# Patient Record
Sex: Female | Born: 2000 | Race: Black or African American | Hispanic: No | Marital: Single | Smoking: Never smoker
Health system: Southern US, Community
[De-identification: ages and names within clinical notes are randomized; demographics above are authoritative.]

## PROBLEM LIST (undated history)

## (undated) DIAGNOSIS — I10 Essential (primary) hypertension: Secondary | ICD-10-CM

---
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
## 2019-09-27 ENCOUNTER — Emergency Department (HOSPITAL_COMMUNITY): Payer: Medicaid Other

## 2019-09-27 ENCOUNTER — Encounter: Payer: Self-pay | Admitting: Obstetrics and Gynecology

## 2019-09-27 ENCOUNTER — Emergency Department (HOSPITAL_COMMUNITY)
Admission: EM | Admit: 2019-09-27 | Discharge: 2019-09-27 | Disposition: A | Discharge status: Home / Self Care | Payer: Medicaid Other | Attending: Emergency Medicine | Admitting: Emergency Medicine

## 2019-09-27 DIAGNOSIS — S91342A Puncture wound with foreign body, left foot, initial encounter: Secondary | ICD-10-CM | POA: Diagnosis not present

## 2019-09-27 DIAGNOSIS — Y999 Unspecified external cause status: Secondary | ICD-10-CM | POA: Diagnosis not present

## 2019-09-27 DIAGNOSIS — Z79899 Other long term (current) drug therapy: Secondary | ICD-10-CM | POA: Diagnosis not present

## 2019-09-27 DIAGNOSIS — W25XXXA Contact with sharp glass, initial encounter: Secondary | ICD-10-CM | POA: Diagnosis not present

## 2019-09-27 DIAGNOSIS — S91332A Puncture wound without foreign body, left foot, initial encounter: Secondary | ICD-10-CM

## 2019-09-27 DIAGNOSIS — Y9389 Activity, other specified: Secondary | ICD-10-CM | POA: Insufficient documentation

## 2019-09-27 DIAGNOSIS — Z1881 Retained glass fragments: Secondary | ICD-10-CM | POA: Diagnosis not present

## 2019-09-27 DIAGNOSIS — Y92810 Car as the place of occurrence of the external cause: Secondary | ICD-10-CM | POA: Insufficient documentation

## 2019-09-27 DIAGNOSIS — S99922A Unspecified injury of left foot, initial encounter: Secondary | ICD-10-CM | POA: Diagnosis present

## 2019-09-27 MED ORDER — LIDOCAINE-EPINEPHRINE 2 %-1:100000 IJ SOLN
20.0000 mL | Freq: Once | INTRAMUSCULAR | Status: AC
  Administered 2019-09-27: 20 mL via INTRADERMAL
  Filled 2019-09-27: qty 1

## 2019-09-27 NOTE — Discharge Instructions (Signed)
Keep area clean by washing with soap and water daily. Apply a new bandage at least once daily, change more often if it is dirty. You can also apply a small amount of antibiotic ointment to the area Watch for signs of infection (redness, drainage, worsening pain) Take Tylenol or Ibuprofen for pain as needed Please follow up with Orthopedics if you continue to have numbness of the 4th toe
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
## 2020-03-04 ENCOUNTER — Ambulatory Visit (HOSPITAL_COMMUNITY): Payer: Self-pay

## 2020-03-09 ENCOUNTER — Ambulatory Visit (HOSPITAL_COMMUNITY): Payer: Self-pay

## 2020-03-09 ENCOUNTER — Other Ambulatory Visit: Payer: Self-pay

## 2020-03-09 ENCOUNTER — Encounter (HOSPITAL_COMMUNITY): Payer: Self-pay

## 2020-03-09 ENCOUNTER — Ambulatory Visit (HOSPITAL_COMMUNITY)
Admission: EM | Admit: 2020-03-09 | Discharge: 2020-03-09 | Disposition: A | Discharge status: Home / Self Care | Payer: Medicaid Other | Attending: Family Medicine | Admitting: Family Medicine

## 2020-03-09 DIAGNOSIS — N898 Other specified noninflammatory disorders of vagina: Secondary | ICD-10-CM

## 2020-03-09 DIAGNOSIS — N760 Acute vaginitis: Secondary | ICD-10-CM | POA: Insufficient documentation

## 2020-03-09 HISTORY — DX: Essential (primary) hypertension: I10

## 2020-03-09 LAB — POCT URINALYSIS DIPSTICK, ED / UC
Bilirubin Urine: NEGATIVE
Glucose, UA: NEGATIVE mg/dL
Hgb urine dipstick: NEGATIVE
Ketones, ur: NEGATIVE mg/dL
Leukocytes,Ua: NEGATIVE
Nitrite: NEGATIVE
Protein, ur: NEGATIVE mg/dL
Specific Gravity, Urine: 1.025 (ref 1.005–1.030)
Urobilinogen, UA: 2 mg/dL — ABNORMAL HIGH (ref 0.0–1.0)
pH: 7 (ref 5.0–8.0)

## 2020-03-09 NOTE — ED Triage Notes (Signed)
Pt in with c/o vaginal discharge and right lower abdominal pain that has been going on for a few months. States that she was treated for chlamydia a few months ago but the sxs never went away.  Requesting std testing  Denies vaginal irritation, dysuria, back pain

## 2020-03-09 NOTE — ED Provider Notes (Signed)
[facility]    CSN: [id number] Arrival date & time: 03/09/20  1603      History   Chief Complaint Chief Complaint  Patient presents with  . Vaginal Discharge    HPI Emma Hines is a 19 y.o. female.   Patient presenting today with several months of off and on right lower pelvic cramping, vaginal discharge. Denies vaginal irritation, dysuria, hematuria, N/V, abdominal pain, fever. Not trying anything OTC for sxs. States she was treated several months ago for chlamydia and feels it never went away.      Past Medical History:  Diagnosis Date  . Hypertension     Patient Active Problem List   Diagnosis Date Noted  . IUD check up 12/17/2018  . Encounter for IUD insertion 11/19/2018  . Postpartum care and examination 11/19/2018  . Preeclampsia, severe, third trimester 10/21/2018  . No prenatal care in current pregnancy 10/21/2018  . Hypokalemia 10/21/2018  . Iron deficiency anemia 10/21/2018    History reviewed. No pertinent surgical history.  OB History    Gravida  1   Para  1   Term  1   Preterm      AB      Living  1     SAB      TAB      Ectopic      Multiple  0   Live Births  1            Home Medications    Prior to Admission medications   Medication Sig Start Date End Date Taking? Authorizing Provider  docusate sodium (COLACE) 100 MG capsule Take 1 capsule (100 mg total) by mouth 2 (two) times daily. 10/24/18   Rolm Bookbinder, CNM  enalapril (VASOTEC) 20 MG tablet Take 1 tablet (20 mg total) by mouth daily. 10/24/18   Rolm Bookbinder, CNM  ferrous sulfate 325 (65 FE) MG tablet Take 1 tablet (325 mg total) by mouth 2 (two) times daily with a meal. 10/24/18   Rolm Bookbinder, CNM  ibuprofen (ADVIL) 800 MG tablet Take 1 tablet (800 mg total) by mouth 3 (three) times daily. 10/24/18   Rolm Bookbinder, CNM  Prenatal Vit-Fe Fumarate-FA (PRENATAL MULTIVITAMIN) TABS tablet Take 1 tablet by mouth daily. 10/24/18   Rolm Bookbinder, CNM    Family History Family History  Problem Relation Age of Onset  . Hypertension Mother   . Hypertension Father     Social History Social History   Tobacco Use  . Smoking status: Never Smoker  . Smokeless tobacco: Never Used  Vaping Use  . Vaping Use: Never used  Substance Use Topics  . Alcohol use: Never  . Drug use: Never     Allergies   [name] [ferumoxytol]   Review of Systems Review of Systems PER HPI   Physical Exam Triage Vital Signs ED Triage Vitals  Enc Vitals Group     BP 03/09/20 1722 118/73     Pulse Rate 03/09/20 1722 84     Resp 03/09/20 1722 18     Temp --      Temp src --      SpO2 03/09/20 1722 100 %     Weight --      Height --      Head Circumference --      Peak Flow --      Pain Score 03/09/20 1719 0     Pain Loc --  Pain Edu? --      Excl. in GC? --    No data found.  Updated Vital Signs BP 118/73 (BP Location: Right Arm)   Pulse 84   Resp 18   LMP 02/07/2020 (Approximate)   SpO2 100%   Breastfeeding No   Visual Acuity Right Eye Distance:   Left Eye Distance:   Bilateral Distance:    Right Eye Near:   Left Eye Near:    Bilateral Near:     Physical Exam Vitals and nursing note reviewed.  Constitutional:      Appearance: Normal appearance. She is not ill-appearing.  HENT:     Head: Atraumatic.  Eyes:     Extraocular Movements: Extraocular movements intact.     Conjunctiva/sclera: Conjunctivae normal.  Cardiovascular:     Rate and Rhythm: Normal rate and regular rhythm.     Heart sounds: Normal heart sounds.  Pulmonary:     Effort: Pulmonary effort is normal.     Breath sounds: Normal breath sounds.  Abdominal:     General: Bowel sounds are normal. There is no distension.     Palpations: Abdomen is soft.     Tenderness: There is no abdominal tenderness. There is no right CVA tenderness, left CVA tenderness or guarding.  Genitourinary:    Comments: GU exam deferred, self swab  performed Musculoskeletal:        General: Normal range of motion.     Cervical back: Normal range of motion and neck supple.  Skin:    General: Skin is warm and dry.  Neurological:     Mental Status: She is alert and oriented to person, place, and time.  Psychiatric:        Mood and Affect: Mood normal.        Thought Content: Thought content normal.        Judgment: Judgment normal.      UC Treatments / Results  Labs (all labs ordered are listed, but only abnormal results are displayed) Labs Reviewed  POCT URINALYSIS DIPSTICK, ED / UC - Abnormal; Notable for the following components:      Result Value   Urobilinogen, UA 2.0 (*)    All other components within normal limits  CERVICOVAGINAL ANCILLARY ONLY    EKG   Radiology No results found.  Procedures Procedures (including critical care time)  Medications Ordered in UC Medications - No data to display  Initial Impression / Assessment and Plan / UC Course  I have reviewed the triage vital signs and the nursing notes.  Pertinent labs & imaging results that were available during my care of the patient were reviewed by me and considered in my medical decision making (see chart for details).     U/A benign today, aptima swab pending. Discussed treatment based on swab results, avoiding sexual contact until results return, safe sexual practices. Return if sxs worsening.  Final Clinical Impressions(s) / UC Diagnoses   Final diagnoses:  Acute vaginitis   Discharge Instructions   None    ED Prescriptions    None     PDMP not reviewed this encounter.   Particia Nearing, [place] 03/09/20 1805

## 2020-03-10 ENCOUNTER — Telehealth (HOSPITAL_COMMUNITY): Payer: Self-pay | Admitting: Emergency Medicine

## 2020-03-10 LAB — CERVICOVAGINAL ANCILLARY ONLY
Bacterial Vaginitis (gardnerella): POSITIVE — AB
Candida Glabrata: NEGATIVE
Candida Vaginitis: NEGATIVE
Chlamydia: NEGATIVE
Comment: NEGATIVE
Comment: NEGATIVE
Comment: NEGATIVE
Comment: NEGATIVE
Comment: NEGATIVE
Comment: NORMAL
Neisseria Gonorrhea: NEGATIVE
Trichomonas: NEGATIVE

## 2020-03-10 MED ORDER — METRONIDAZOLE 500 MG PO TABS: 500.0000 mg | ORAL_TABLET | Freq: Two times a day (BID) | ORAL | 0 refills | Status: DC

## 2020-11-08 IMAGING — CR DG FOOT COMPLETE 3+V*L*
3 series · 3 of 3 positions shown · non-contrast
Comparison: None.

CLINICAL DATA: Stepped on glass. Left foot pain. Initial encounter.

EXAM:
LEFT FOOT - COMPLETE 3+ VIEW

[x foot ap left]
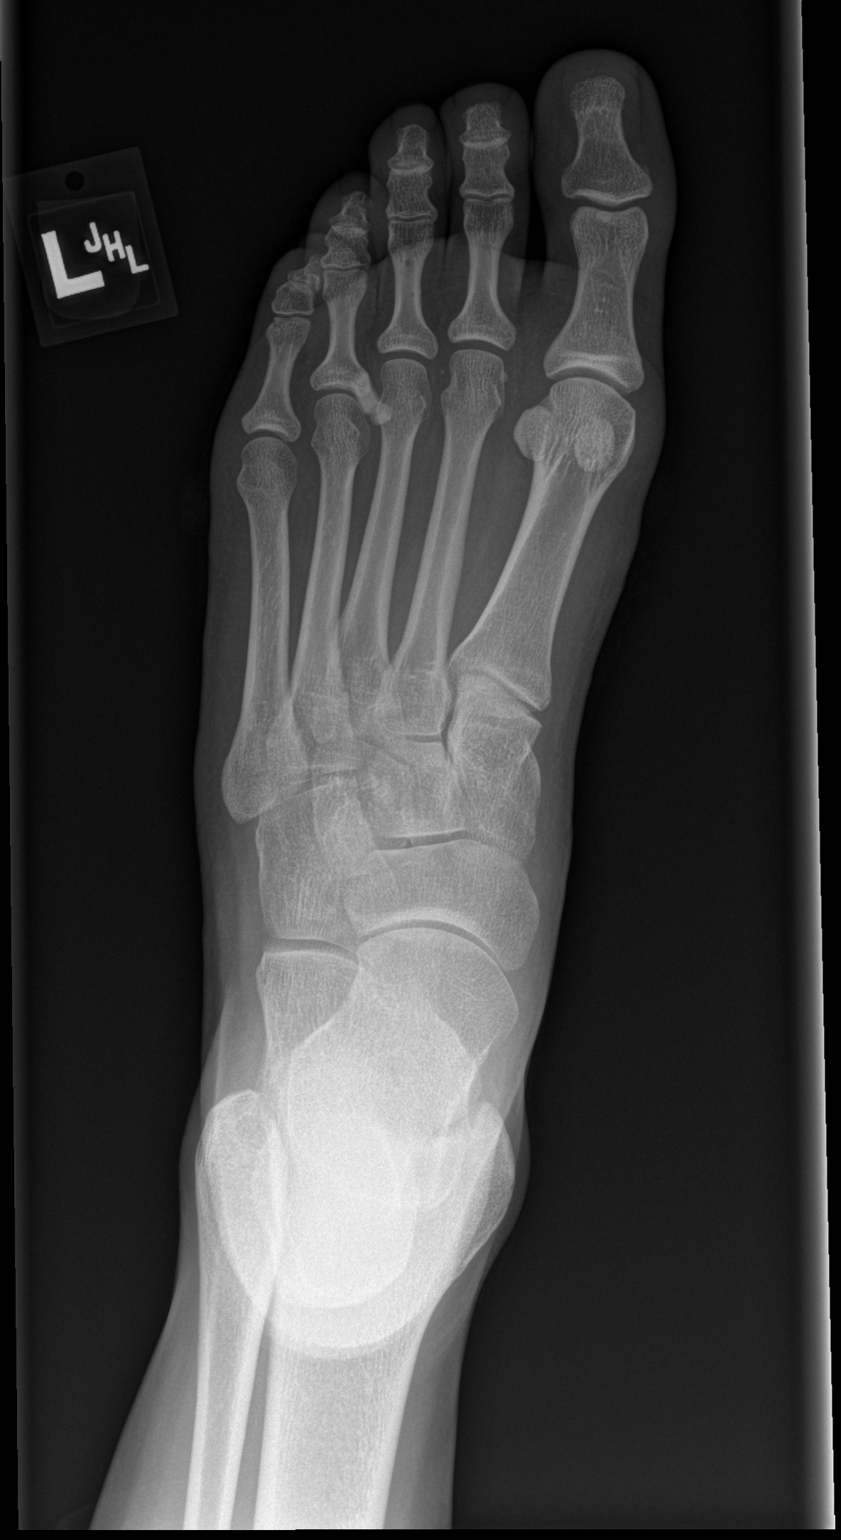

[x foot obl left]
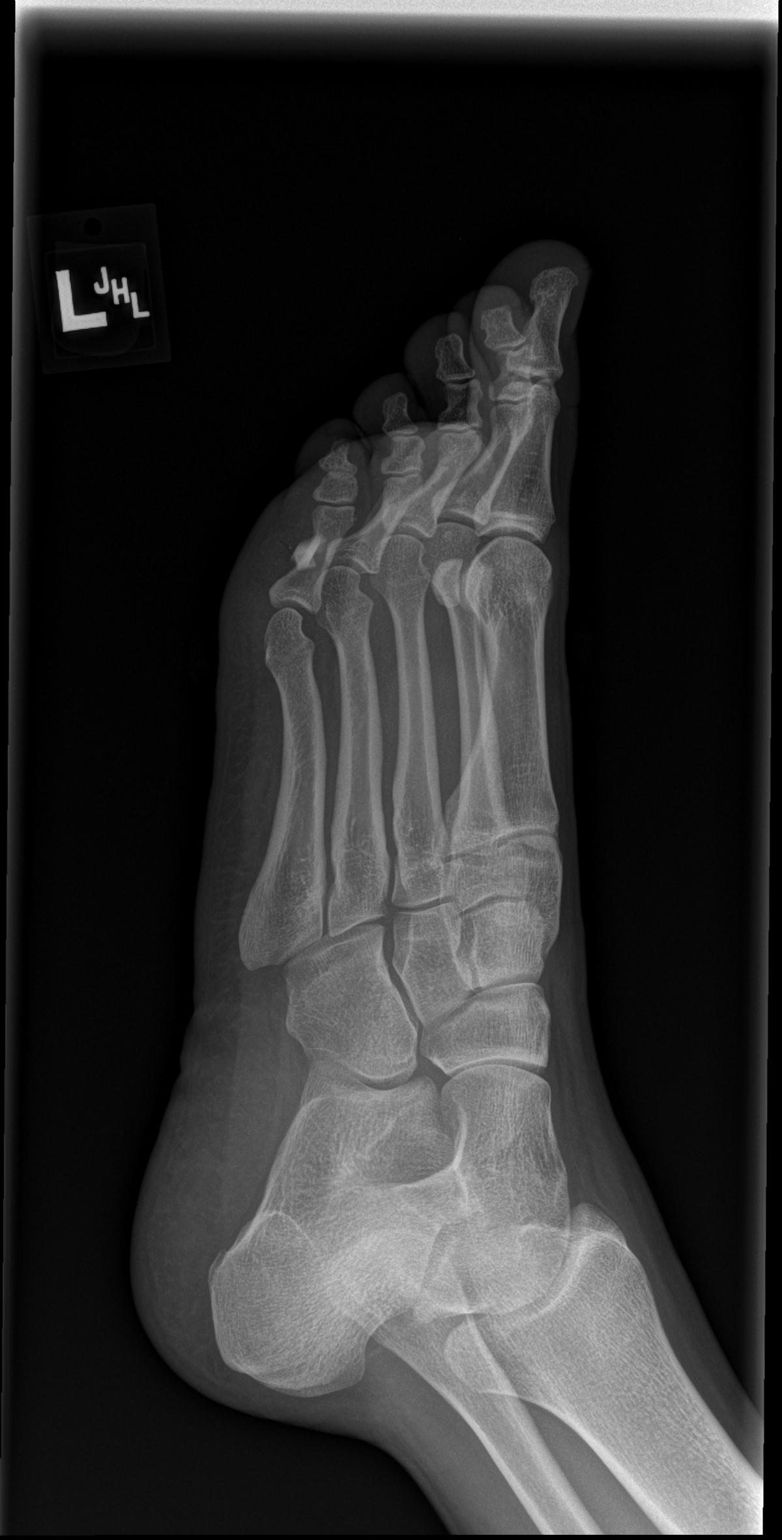

[x foot lat left]
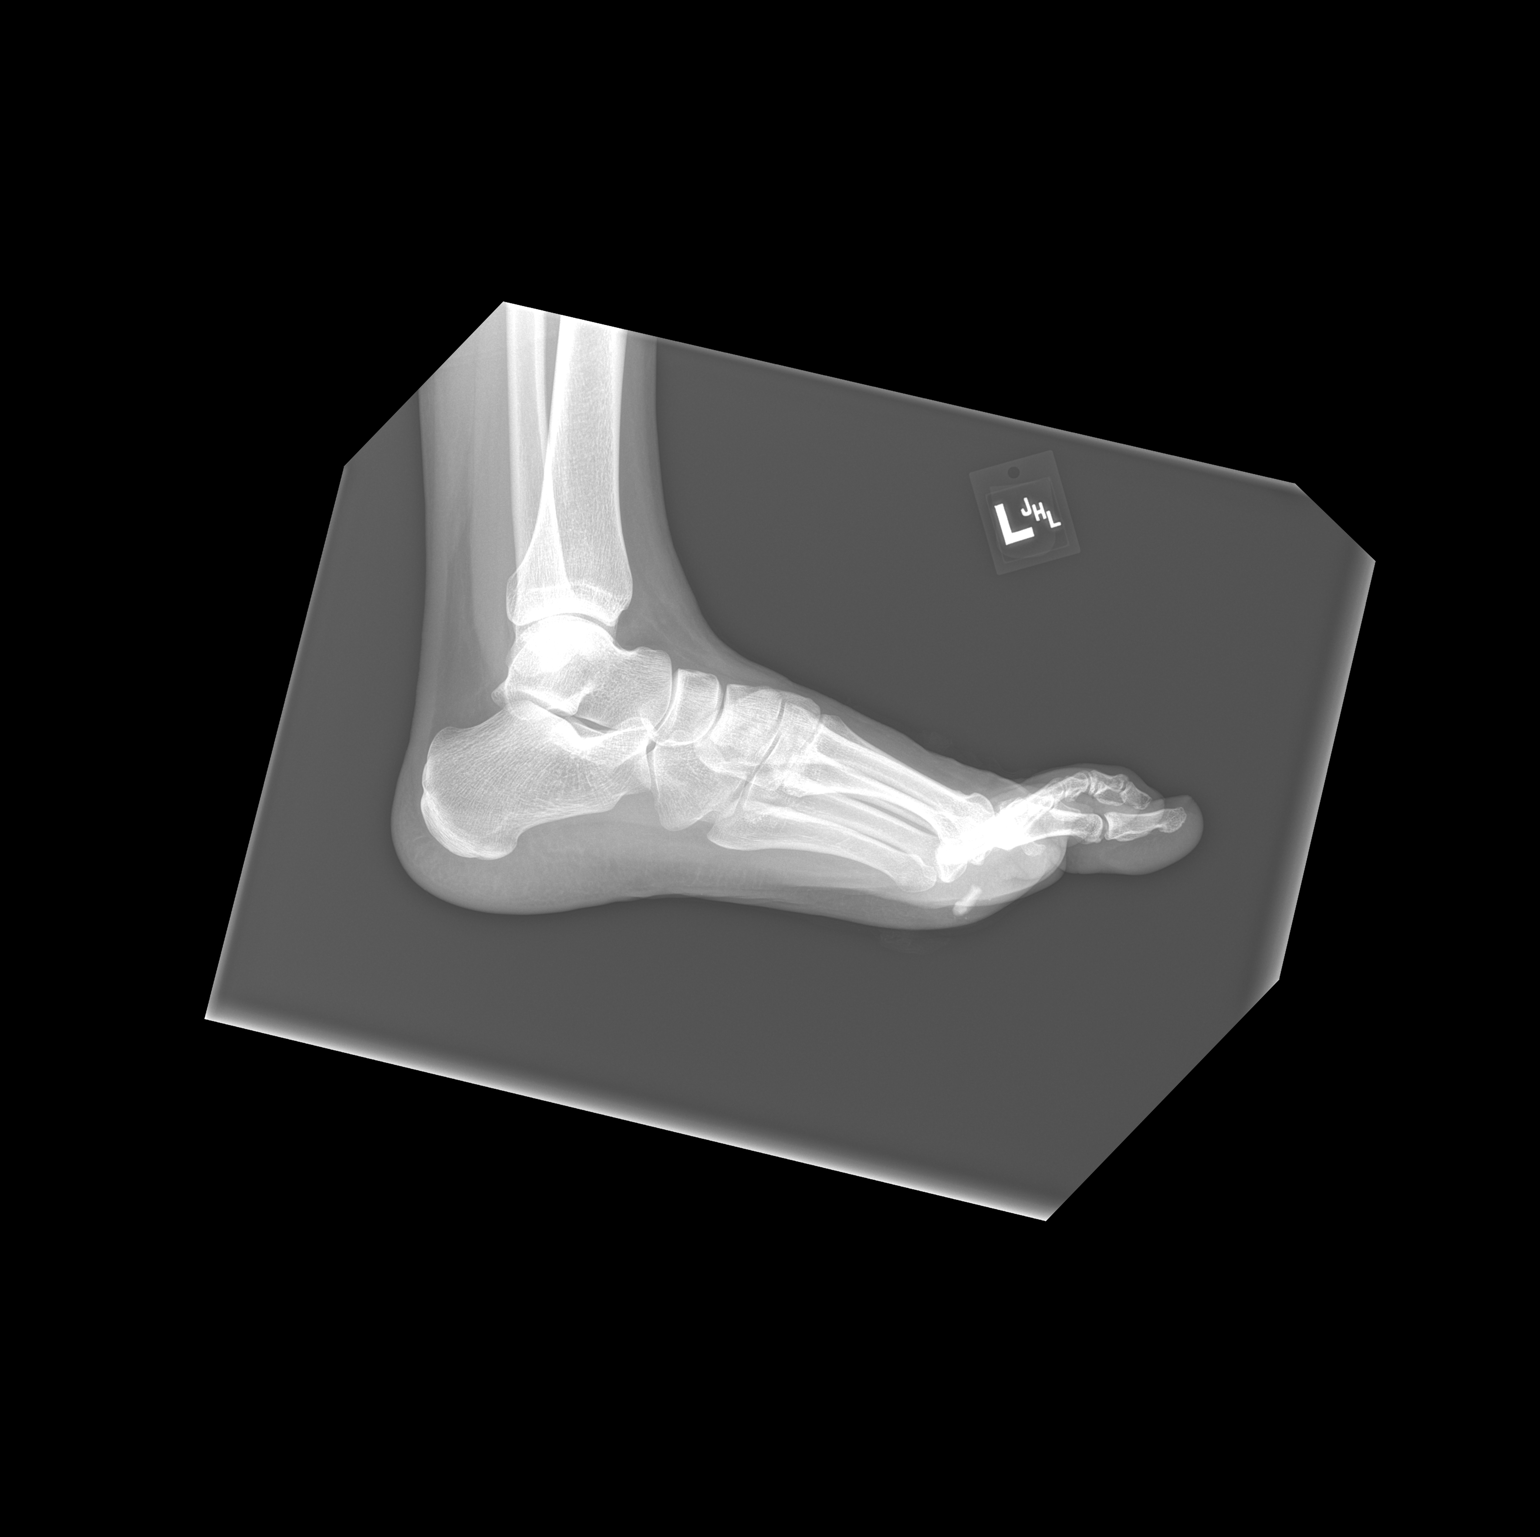

[3 of 3 positions shown; findings below may reference images not displayed]

FINDINGS: There is no evidence of fracture or dislocation. There is no
evidence of arthropathy or other focal bone abnormality. A
radiopaque foreign body is seen in the plantar soft tissues adjacent
to the 4th MTP joint.
IMPRESSION: Radiopaque foreign body in the plantar soft tissues adjacent to the
4th MTP joint. No osseous abnormality.

## 2020-11-08 IMAGING — CR DG FOOT COMPLETE 3+V*L*
3 series · 3 of 3 positions shown · non-contrast
Comparison: Radiograph earlier today.

CLINICAL DATA: Status post removal of glass.

EXAM:
LEFT FOOT - COMPLETE 3+ VIEW

[x foot ap left]
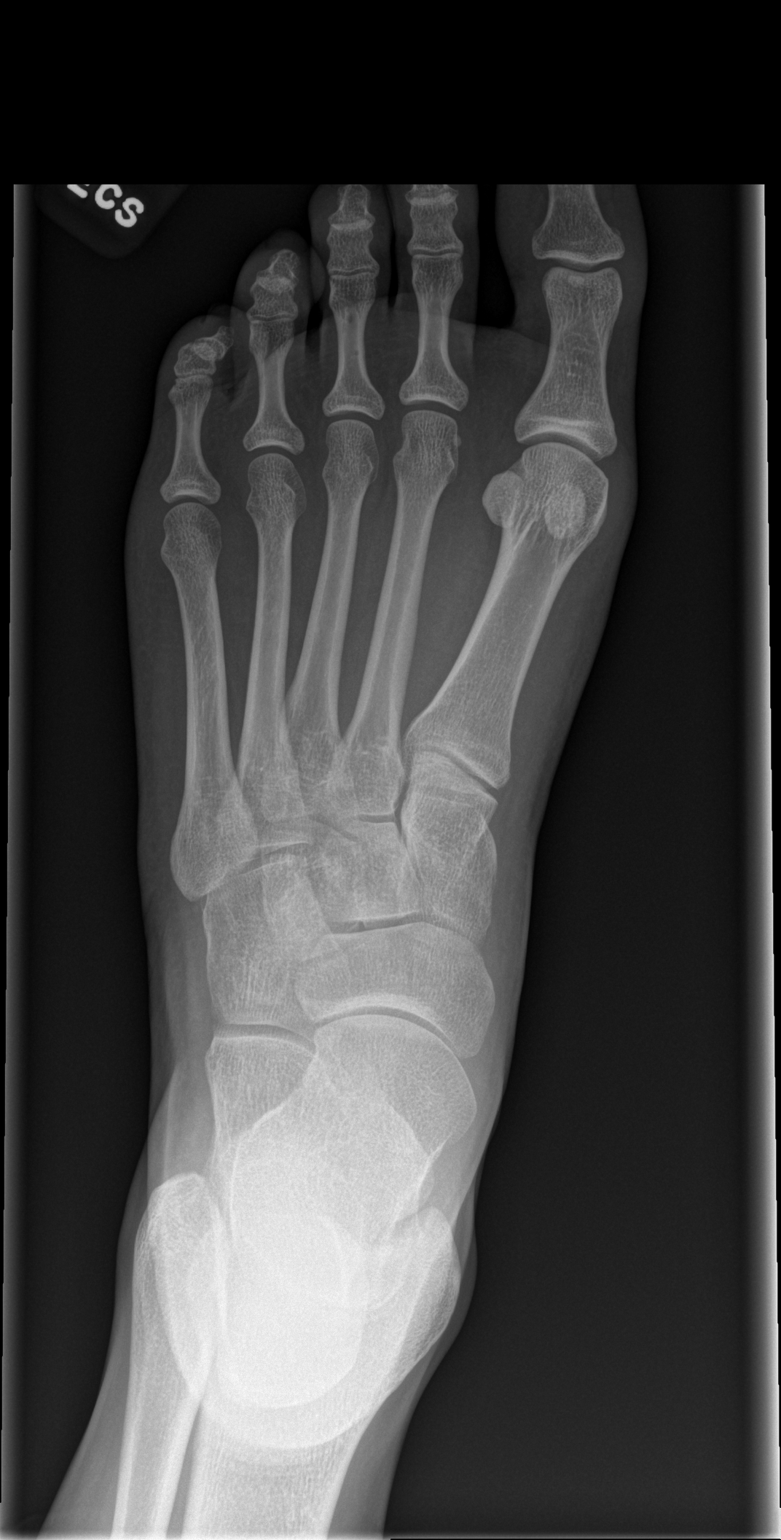

[x foot obl left]
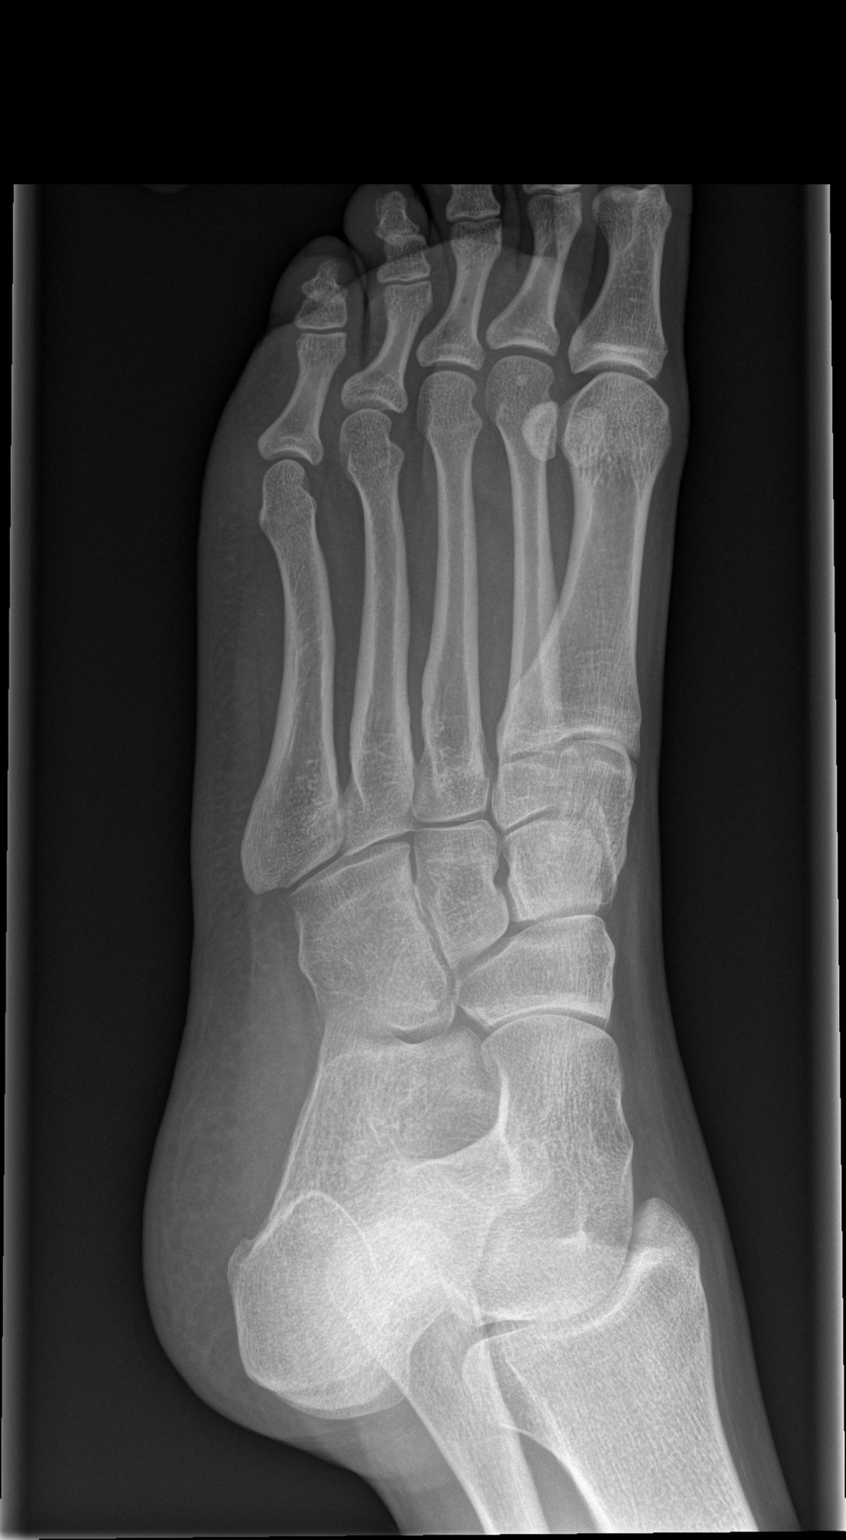

[x foot lat left]
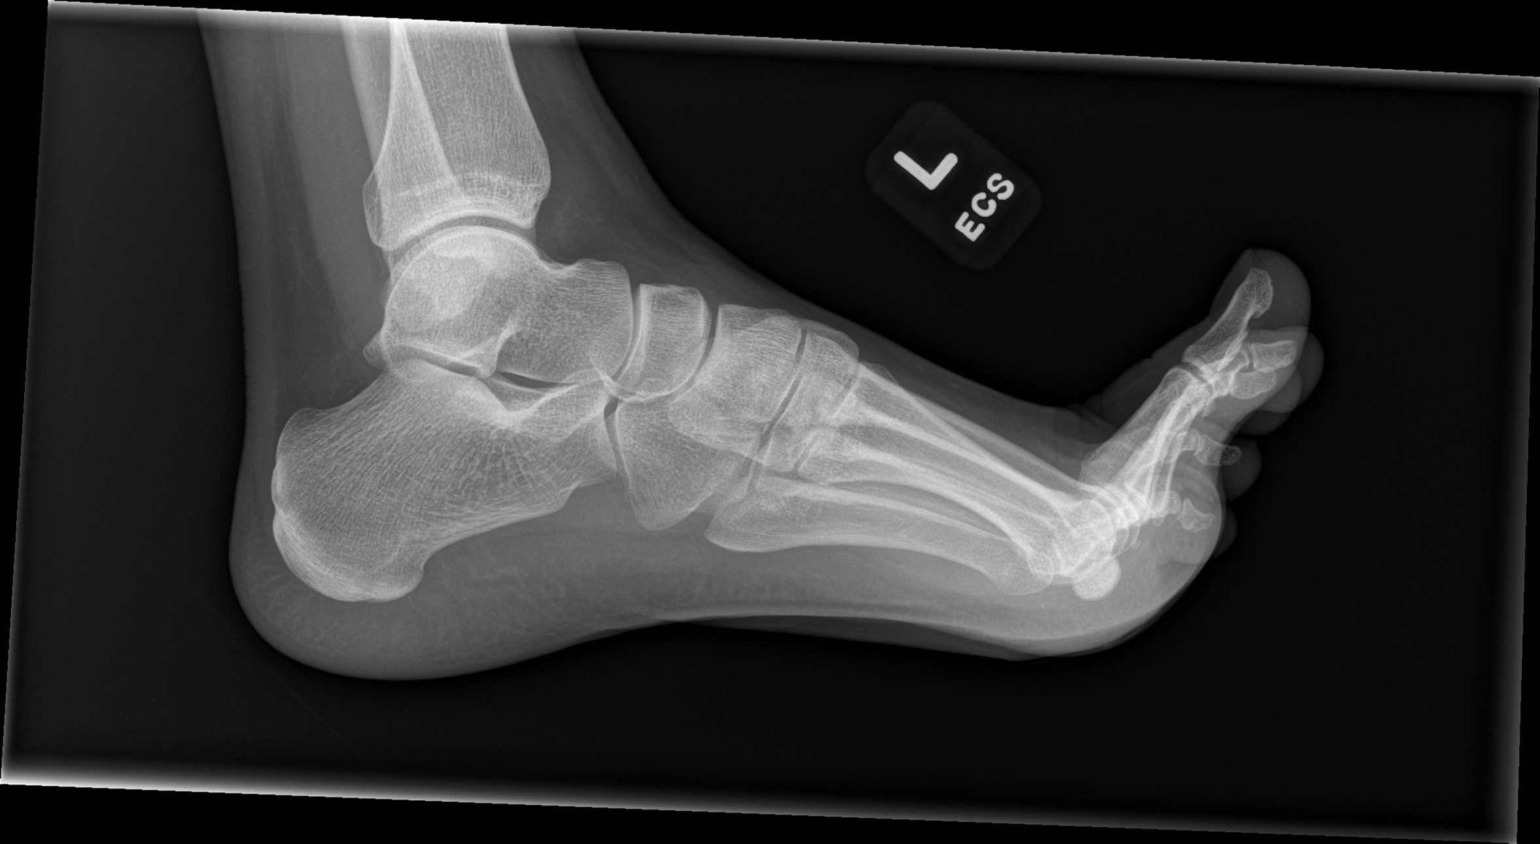

[3 of 3 positions shown; findings below may reference images not displayed]

FINDINGS: Radiopaque glass foreign body has been removed without residual. No
osseous abnormalities or other interval change.
IMPRESSION: Interval removal of radiopaque glass foreign body.  No residual.

## 2021-02-28 ENCOUNTER — Ambulatory Visit (HOSPITAL_COMMUNITY): Payer: Self-pay

## 2022-04-21 ENCOUNTER — Ambulatory Visit (HOSPITAL_COMMUNITY)
Admission: EM | Admit: 2022-04-21 | Discharge: 2022-04-21 | Disposition: A | Discharge status: Home / Self Care | Payer: Medicaid Other | Attending: Emergency Medicine | Admitting: Emergency Medicine

## 2022-04-21 ENCOUNTER — Encounter (HOSPITAL_COMMUNITY): Payer: Self-pay | Admitting: *Deleted

## 2022-04-21 DIAGNOSIS — Z202 Contact with and (suspected) exposure to infections with a predominantly sexual mode of transmission: Secondary | ICD-10-CM | POA: Insufficient documentation

## 2022-04-21 DIAGNOSIS — N898 Other specified noninflammatory disorders of vagina: Secondary | ICD-10-CM | POA: Insufficient documentation

## 2022-04-21 LAB — POCT URINALYSIS DIPSTICK, ED / UC
Bilirubin Urine: NEGATIVE
Glucose, UA: NEGATIVE mg/dL
Hgb urine dipstick: NEGATIVE
Ketones, ur: NEGATIVE mg/dL
Nitrite: NEGATIVE
Protein, ur: NEGATIVE mg/dL
Specific Gravity, Urine: 1.025 (ref 1.005–1.030)
Urobilinogen, UA: 1 mg/dL (ref 0.0–1.0)
pH: 6 (ref 5.0–8.0)

## 2022-04-21 LAB — POC URINE PREG, ED: Preg Test, Ur: NEGATIVE

## 2022-04-21 MED ORDER — METRONIDAZOLE 500 MG PO TABS: 500.0000 mg | ORAL_TABLET | Freq: Two times a day (BID) | ORAL | 0 refills | Status: DC

## 2022-04-21 NOTE — ED Provider Notes (Signed)
[facility]    CSN: [id number] Arrival date & time: 04/21/22  1138      History   Chief Complaint Chief Complaint  Patient presents with   Vaginal Discharge   Abdominal Pain    HPI Emma Hines is a 21 y.o. female.   Patient presents with clear thin vaginal discharge and right flank and lower abdomen pain beginning 2 to 3 days ago.  Sexually active, 1 female partner, no condom use, exposure to trichomoniasis.  Denies vaginal odor, vaginal itching or irritation, urinary symptoms, fevers.  Not attempted treatment of symptoms.   Past Medical History:  Diagnosis Date   Hypertension     Patient Active Problem List   Diagnosis Date Noted   IUD check up 12/17/2018   Encounter for IUD insertion 11/19/2018   Postpartum care and examination 11/19/2018   Preeclampsia, severe, third trimester 10/21/2018   No prenatal care in current pregnancy 10/21/2018   Hypokalemia 10/21/2018   Iron deficiency anemia 10/21/2018    History reviewed. No pertinent surgical history.  OB History     Gravida  1   Para  1   Term  1   Preterm      AB      Living  1      SAB      IAB      Ectopic      Multiple  0   Live Births  1            Home Medications    Prior to Admission medications   Medication Sig Start Date End Date Taking? Authorizing Provider  docusate sodium (COLACE) 100 MG capsule Take 1 capsule (100 mg total) by mouth 2 (two) times daily. 10/24/18   Rolm Bookbinder, CNM  enalapril (VASOTEC) 20 MG tablet Take 1 tablet (20 mg total) by mouth daily. 10/24/18   Rolm Bookbinder, CNM  ferrous sulfate 325 (65 FE) MG tablet Take 1 tablet (325 mg total) by mouth 2 (two) times daily with a meal. 10/24/18   Rolm Bookbinder, CNM  ibuprofen (ADVIL) 800 MG tablet Take 1 tablet (800 mg total) by mouth 3 (three) times daily. 10/24/18   Rolm Bookbinder, CNM  metroNIDAZOLE (FLAGYL) 500 MG tablet Take 1 tablet (500 mg total) by mouth 2 (two) times daily.  03/10/20   LampteyBritta Mccreedy, MD  Prenatal Vit-Fe Fumarate-FA (PRENATAL MULTIVITAMIN) TABS tablet Take 1 tablet by mouth daily. 10/24/18   Rolm Bookbinder, CNM    Family History Family History  Problem Relation Age of Onset   Hypertension Mother    Hypertension Father     Social History Social History   Tobacco Use   Smoking status: Never   Smokeless tobacco: Never  Vaping Use   Vaping Use: Never used  Substance Use Topics   Alcohol use: Never   Drug use: Never     Allergies   [name] [ferumoxytol]   Review of Systems Review of Systems  Respiratory: Negative.    Cardiovascular: Negative.   Gastrointestinal: Negative.   Genitourinary:  Positive for flank pain, pelvic pain and vaginal discharge. Negative for decreased urine volume, difficulty urinating, dyspareunia, dysuria, enuresis, frequency, genital sores, hematuria, menstrual problem, urgency, vaginal bleeding and vaginal pain.     Physical Exam Triage Vital Signs ED Triage Vitals  Enc Vitals Group     BP 04/21/22 1247 (!) 104/56     Pulse Rate 04/21/22 1247 90  Resp 04/21/22 1247 18     Temp 04/21/22 1247 98.2 F (36.8 C)     Temp Source 04/21/22 1247 Oral     SpO2 04/21/22 1247 99 %     Weight --      Height --      Head Circumference --      Peak Flow --      Pain Score 04/21/22 1248 5     Pain Loc --      Pain Edu? --      Excl. in GC? --    No data found.  Updated Vital Signs BP (!) 104/56   Pulse 90   Temp 98.2 F (36.8 C) (Oral)   Resp 18   SpO2 99%   Visual Acuity Right Eye Distance:   Left Eye Distance:   Bilateral Distance:    Right Eye Near:   Left Eye Near:    Bilateral Near:     Physical Exam Constitutional:      Appearance: She is well-developed.  Pulmonary:     Effort: Pulmonary effort is normal.  Abdominal:     General: Abdomen is flat. Bowel sounds are normal. There is no distension.     Palpations: Abdomen is soft.     Tenderness: There is no abdominal  tenderness. There is no right CVA tenderness, left CVA tenderness or guarding.  Genitourinary:    Comments: deferred Neurological:     Mental Status: She is alert and oriented to person, place, and time.      UC Treatments / Results  Labs (all labs ordered are listed, but only abnormal results are displayed) Labs Reviewed  POCT URINALYSIS DIPSTICK, ED / UC - Abnormal; Notable for the following components:      Result Value   Leukocytes,Ua TRACE (*)    All other components within normal limits  POC URINE PREG, ED  CERVICOVAGINAL ANCILLARY ONLY    EKG   Radiology No results found.  Procedures Procedures (including critical care time)  Medications Ordered in UC Medications - No data to display  Initial Impression / Assessment and Plan / UC Course  I have reviewed the triage vital signs and the nursing notes.  Pertinent labs & imaging results that were available during my care of the patient were reviewed by me and considered in my medical decision making (see chart for details).  Vaginal discharge, exposure to treatment  Will prophylactically treat, metronidazole prescribed, discussed administration and advised abstaining from alcohol during use, STI labs are trending, will treat further per protocol, advised abstinence until lab results, treatment is complete and all symptoms have resolved, may follow-up with his urgent care as needed Final Clinical Impressions(s) / UC Diagnoses   Final diagnoses:  None   Discharge Instructions   None    ED Prescriptions   None    PDMP not reviewed this encounter.   Valinda Hoar, NP 04/21/22 1326

## 2022-04-21 NOTE — Discharge Instructions (Addendum)
Today you are being treated prophylactically for trichomoniasis due to known exposure  Take metronidazole every morning and every evening for 7 days, do not drink alcohol while using this medicine as it will make you feel ill, treatment for women is longer than men  Labs pending 2-3 days, you will be contacted if positive for any sti and treatment will be sent to the pharmacy, you will have to return to the clinic if positive for gonorrhea to receive treatment   Please refrain from having sex until labs results, if positive please refrain from having sex until treatment complete and symptoms resolve   If positive for  Chlamydia  gonorrhea or trichomoniasis please notify partner or partners so they may tested as well  Moving forward, it is recommended you use some form of protection against the transmission of sti infections  such as condoms or dental dams with each sexual encounter

## 2022-04-21 NOTE — ED Triage Notes (Addendum)
C/O vaginal discharge with low abd pain, and occasionally in right side, onset few days ago. No known fevers.

## 2022-04-23 LAB — CERVICOVAGINAL ANCILLARY ONLY
Bacterial Vaginitis (gardnerella): POSITIVE — AB
Candida Glabrata: NEGATIVE
Candida Vaginitis: POSITIVE — AB
Chlamydia: POSITIVE — AB
Comment: NEGATIVE
Comment: NEGATIVE
Comment: NEGATIVE
Comment: NEGATIVE
Comment: NEGATIVE
Comment: NORMAL
Neisseria Gonorrhea: NEGATIVE
Trichomonas: POSITIVE — AB

## 2022-04-24 ENCOUNTER — Telehealth (HOSPITAL_COMMUNITY): Payer: Self-pay | Admitting: Emergency Medicine

## 2022-04-24 MED ORDER — DOXYCYCLINE HYCLATE 100 MG PO CAPS: 100.0000 mg | ORAL_CAPSULE | Freq: Two times a day (BID) | ORAL | 0 refills | Status: AC

## 2022-04-24 MED ORDER — FLUCONAZOLE 150 MG PO TABS: 150.0000 mg | ORAL_TABLET | Freq: Once | ORAL | 0 refills | Status: AC

## 2023-03-18 ENCOUNTER — Ambulatory Visit (HOSPITAL_COMMUNITY): Payer: Medicaid Other

## 2023-06-23 ENCOUNTER — Ambulatory Visit (HOSPITAL_COMMUNITY): Payer: Self-pay

## 2023-06-25 ENCOUNTER — Ambulatory Visit (HOSPITAL_COMMUNITY): Payer: Medicaid Other

## 2023-06-27 ENCOUNTER — Ambulatory Visit (HOSPITAL_COMMUNITY)
Admission: RE | Admit: 2023-06-27 | Discharge: 2023-06-27 | Disposition: A | Discharge status: Home / Self Care | Payer: Medicaid Other | Source: Ambulatory Visit | Attending: Emergency Medicine | Admitting: Emergency Medicine

## 2023-06-27 ENCOUNTER — Encounter (HOSPITAL_COMMUNITY): Payer: Self-pay

## 2023-06-27 ENCOUNTER — Ambulatory Visit (HOSPITAL_COMMUNITY): Payer: Medicaid Other

## 2023-06-27 VITALS — BP 107/73 | HR 96 | Temp 98.1°F | Resp 14

## 2023-06-27 DIAGNOSIS — K0889 Other specified disorders of teeth and supporting structures: Secondary | ICD-10-CM

## 2023-06-27 MED ORDER — AMOXICILLIN-POT CLAVULANATE 875-125 MG PO TABS: 1.0000 | ORAL_TABLET | Freq: Two times a day (BID) | ORAL | 0 refills | Status: DC

## 2023-06-27 NOTE — ED Triage Notes (Signed)
 Patient states she has a broken wisdom tooth on the right upper jaw and has been x 1 month.  Patient reports intermittent pain and has been constant for the past 3 days.

## 2023-06-27 NOTE — ED Provider Notes (Signed)
 [facility]    CSN: [id number] Arrival date & time: 06/27/23  1428      History   Chief Complaint No chief complaint on file.   HPI Emma Hines is a 23 y.o. female.   Patient presents to clinic over concern of right upper wisdom tooth pain.  The tooth has been broken for the past month and will cause intermittent pain.  Last night the pain was throbbing down her entire face and keeping her up at night.  Of note, she reports she is allergic to generic ibuprofen but able to tolerate Advil.  Has been taking 400 mg of Advil intermittently, last dose last night.  Does not currently have a dentist or an Transport planner.  Does not think she has had any fevers.  Unable to chew on that side, as the tooth is tender to touch.  Hot and cold foods hurt her as well.   The history is provided by the patient and medical records.    Past Medical History:  Diagnosis Date   Hypertension     Patient Active Problem List   Diagnosis Date Noted   IUD check up 12/17/2018   Encounter for IUD insertion 11/19/2018   Postpartum care and examination 11/19/2018   Preeclampsia, severe, third trimester 10/21/2018   No prenatal care in current pregnancy 10/21/2018   Hypokalemia 10/21/2018   Iron deficiency anemia 10/21/2018    History reviewed. No pertinent surgical history.  OB History     Gravida  1   Para  1   Term  1   Preterm      AB      Living  1      SAB      IAB      Ectopic      Multiple  0   Live Births  1            Home Medications    Prior to Admission medications   Medication Sig Start Date End Date Taking? Authorizing Provider  amoxicillin-clavulanate (AUGMENTIN) 875-125 MG tablet Take 1 tablet by mouth every 12 (twelve) hours. 06/27/23  Yes Rinaldo Ratel, [name], FNP    Family History Family History  Problem Relation Age of Onset   Hypertension Mother    Hypertension Father     Social History Social History   Tobacco Use   Smoking  status: Never   Smokeless tobacco: Never  Vaping Use   Vaping status: Never Used  Substance Use Topics   Alcohol use: Never   Drug use: Never     Allergies   [name] [ferumoxytol] and Ibuprofen   Review of Systems Review of Systems  Per HPI   Physical Exam Triage Vital Signs ED Triage Vitals [06/27/23 1503]  Encounter Vitals Group     BP 107/73     Systolic BP Percentile      Diastolic BP Percentile      Pulse Rate 96     Resp 14     Temp 98.1 F (36.7 C)     Temp Source Oral     SpO2 98 %     Weight      Height      Head Circumference      Peak Flow      Pain Score 10     Pain Loc      Pain Education      Exclude from Growth Chart    No data found.  Updated  Vital Signs BP 107/73 (BP Location: Left Arm)   Pulse 96   Temp 98.1 F (36.7 C) (Oral)   Resp 14   SpO2 98%   Visual Acuity Right Eye Distance:   Left Eye Distance:   Bilateral Distance:    Right Eye Near:   Left Eye Near:    Bilateral Near:     Physical Exam Vitals and nursing note reviewed.  Constitutional:      Appearance: Normal appearance.  HENT:     Head: Normocephalic and atraumatic.     Right Ear: External ear normal.     Left Ear: External ear normal.     Nose: Nose normal.     Mouth/Throat:     Mouth: Mucous membranes are moist.      Comments: Broken wisdom tooth to right upper, TTP, no obvious abscess, swelling or drainage Eyes:     Conjunctiva/sclera: Conjunctivae normal.  Cardiovascular:     Rate and Rhythm: Normal rate.  Pulmonary:     Effort: Pulmonary effort is normal. No respiratory distress.  Skin:    General: Skin is warm and dry.  Neurological:     General: No focal deficit present.     Mental Status: She is alert and oriented to person, place, and time.  Psychiatric:        Mood and Affect: Mood normal.        Behavior: Behavior normal. Behavior is cooperative.      UC Treatments / Results  Labs (all labs ordered are listed, but only abnormal  results are displayed) Labs Reviewed - No data to display  EKG   Radiology No results found.  Procedures Procedures (including critical care time)  Medications Ordered in UC Medications - No data to display  Initial Impression / Assessment and Plan / UC Course  I have reviewed the triage vital signs and the nursing notes.  Pertinent labs & imaging results that were available during my care of the patient were reviewed by me and considered in my medical decision making (see chart for details).  Vitals and triage reviewed, patient is hemodynamically stable.  Broken wisdom tooth that is tender to palpation present on physical exam of the right upper jaw.  No obvious swelling or lymphadenopathy.  Afebrile.  Will cover with Augmentin due to recent increase in pain and pain management discussed.  Declined Toradol injection in clinic.  Local dental resources provided, follow-up encouraged.  Work note provided.  Plan of care, follow-up care return precautions given, no questions at this time.     Final Clinical Impressions(s) / UC Diagnoses   Final diagnoses:  Pain, dental     Discharge Instructions      Take the Augmentin twice daily with food until finished.  Avoid hot or cold foods or chewing on this side with the exposed tooth.  You can take 800 mg of Advil every 8 hours for pain and inflammation.  It is important that you follow-up with a dentist/oral surgeon for further treatment of your broken tooth.  Below are some local dental resources.  [facility] [place] Address: [address] [place], [place], [place] [address] Phone: [phone]  [facility] [phone] extension [phone] [address] [place] [address]  Dr. Lawrence Marseilles [phone] [address]  [place] [phone] [address] [facility] [place].  [facility] [phone] extension [phone] [place], [place], [address] First come first serve for the first 10 clients.   May do  simple extractions only, no wisdom teeth or surgery.  You may try the second for Thursday of the month starting at 6:30 AM.  [facility] You may call the school to see if they are still helping to provide dental care for emergent cases.     ED Prescriptions     Medication Sig Dispense Auth. Provider   amoxicillin-clavulanate (AUGMENTIN) 875-125 MG tablet Take 1 tablet by mouth every 12 (twelve) hours. 14 tablet Kameryn Davern, [name], [place]      PDMP not reviewed this encounter.   Revel Stellmach, [name], [place] 06/27/23 1540

## 2023-06-27 NOTE — Discharge Instructions (Addendum)
 Take the Augmentin twice daily with food until finished.  Avoid hot or cold foods or chewing on this side with the exposed tooth.  You can take 800 mg of Advil every 8 hours for pain and inflammation.  It is important that you follow-up with a dentist/oral surgeon for further treatment of your broken tooth.  Below are some local dental resources.  [facility] in [place] Address: [address] [place], [place], [place] [address] Phone: [phone]  [facility] [phone] extension [phone] [address] [place] [address]  Dr. Lawrence Marseilles [phone] [address]  [place] [phone] [address] [facility] [place].  [facility] mission [phone] extension [phone], [place], [place], [address] First come first serve for the first 10 clients.  May do simple extractions only, no wisdom teeth or surgery.  You may try the second for Thursday of the month starting at 6:30 AM.  [facility] You may call the school to see if they are still helping to provide dental care for emergent cases.

## 2023-07-17 ENCOUNTER — Ambulatory Visit (HOSPITAL_COMMUNITY): Payer: Self-pay

## 2023-07-18 ENCOUNTER — Ambulatory Visit (HOSPITAL_COMMUNITY)

## 2023-07-19 ENCOUNTER — Ambulatory Visit

## 2023-08-10 ENCOUNTER — Encounter (HOSPITAL_COMMUNITY): Payer: Self-pay

## 2023-08-10 ENCOUNTER — Ambulatory Visit (HOSPITAL_COMMUNITY)
Admission: RE | Admit: 2023-08-10 | Discharge: 2023-08-10 | Discharge status: Home / Self Care | Source: Ambulatory Visit | Attending: Emergency Medicine

## 2023-08-10 VITALS — BP 111/73 | HR 91 | Temp 98.4°F | Resp 15

## 2023-08-10 DIAGNOSIS — N898 Other specified noninflammatory disorders of vagina: Secondary | ICD-10-CM | POA: Diagnosis present

## 2023-08-10 LAB — POCT URINALYSIS DIP (MANUAL ENTRY)
Bilirubin, UA: NEGATIVE
Blood, UA: NEGATIVE
Glucose, UA: NEGATIVE mg/dL
Leukocytes, UA: NEGATIVE
Nitrite, UA: NEGATIVE
Protein Ur, POC: NEGATIVE mg/dL
Spec Grav, UA: 1.025 (ref 1.010–1.025)
Urobilinogen, UA: 1 U/dL
pH, UA: 6 (ref 5.0–8.0)

## 2023-08-10 MED ORDER — FLUCONAZOLE 150 MG PO TABS: 150.0000 mg | ORAL_TABLET | Freq: Every day | ORAL | 0 refills | Status: DC

## 2023-08-10 NOTE — Discharge Instructions (Addendum)
 Your urine does not show any signs of infection.  Take the fluconazole, this will treat a yeast infection.  Your cytology swab will be back over the next few days and our staff will contact you with additional treatment as needed.  Abstain from intercourse until results have been received.  Return to clinic for any new or urgent symptoms.

## 2023-08-10 NOTE — ED Provider Notes (Signed)
 [facility]    CSN: [id number] Arrival date & time: 08/10/23  1101      History   Chief Complaint Chief Complaint  Patient presents with   Appointment    HPI Emma Hines is a 23 y.o. female.   Patient presents to clinic over concern of vaginal itching, irritation and increased white discharge without odor. Symptoms started sometime over the past week or two, did have unprotected intercourse last week, has not had any new sexual partners. Has some intermittent low back pain, denies abdominal pain, pelvic pain or N/V. Has not had any fevers.   Had Liletta IUD placed in 2020, this is good for up to 8 years. Has had yeast infections in the past.  The history is provided by medical records and the patient.    Past Medical History:  Diagnosis Date   Hypertension     Patient Active Problem List   Diagnosis Date Noted   IUD check up 12/17/2018   Encounter for IUD insertion 11/19/2018   Postpartum care and examination 11/19/2018   Preeclampsia, severe, third trimester 10/21/2018   No prenatal care in current pregnancy 10/21/2018   Hypokalemia 10/21/2018   Iron deficiency anemia 10/21/2018    History reviewed. No pertinent surgical history.  OB History     Gravida  1   Para  1   Term  1   Preterm      AB      Living  1      SAB      IAB      Ectopic      Multiple  0   Live Births  1            Home Medications    Prior to Admission medications   Medication Sig Start Date End Date Taking? Authorizing Provider  fluconazole (DIFLUCAN) 150 MG tablet Take 1 tablet (150 mg total) by mouth daily. 08/10/23  Yes Harlow Lighter, [place] Journey  N, FNP    Family History Family History  Problem Relation Age of Onset   Hypertension Mother    Hypertension Father     Social History Social History   Tobacco Use   Smoking status: Never   Smokeless tobacco: Never  Vaping Use   Vaping status: Never Used  Substance Use Topics   Alcohol use: Never    Drug use: Never     Allergies   [name] [ferumoxytol] and Ibuprofen   Review of Systems Review of Systems  Per HPI  Physical Exam Triage Vital Signs ED Triage Vitals  Encounter Vitals Group     BP 08/10/23 1134 111/73     Systolic BP Percentile --      Diastolic BP Percentile --      Pulse Rate 08/10/23 1134 91     Resp 08/10/23 1134 15     Temp 08/10/23 1134 98.4 F (36.9 C)     Temp Source 08/10/23 1134 Oral     SpO2 08/10/23 1134 97 %     Weight --      Height --      Head Circumference --      Peak Flow --      Pain Score 08/10/23 1133 0     Pain Loc --      Pain Education --      Exclude from Growth Chart --    No data found.  Updated Vital Signs BP 111/73 (BP Location: Right Arm)   Pulse 91  Temp 98.4 F (36.9 C) (Oral)   Resp 15   SpO2 97%   Visual Acuity Right Eye Distance:   Left Eye Distance:   Bilateral Distance:    Right Eye Near:   Left Eye Near:    Bilateral Near:     Physical Exam Vitals and nursing note reviewed.  Constitutional:      Appearance: Normal appearance.  HENT:     Head: Normocephalic and atraumatic.     Right Ear: External ear normal.     Left Ear: External ear normal.     Nose: Nose normal.     Mouth/Throat:     Mouth: Mucous membranes are moist.  Eyes:     Conjunctiva/sclera: Conjunctivae normal.  Cardiovascular:     Rate and Rhythm: Normal rate.  Pulmonary:     Effort: Pulmonary effort is normal. No respiratory distress.  Musculoskeletal:        General: Normal range of motion.  Neurological:     General: No focal deficit present.     Mental Status: She is alert.  Psychiatric:        Mood and Affect: Mood normal.      UC Treatments / Results  Labs (all labs ordered are listed, but only abnormal results are displayed) Labs Reviewed  POCT URINALYSIS DIP (MANUAL ENTRY) - Abnormal; Notable for the following components:      Result Value   Ketones, POC UA trace (5) (*)    All other components within  normal limits  CERVICOVAGINAL ANCILLARY ONLY    EKG   Radiology No results found.  Procedures Procedures (including critical care time)  Medications Ordered in UC Medications - No data to display  Initial Impression / Assessment and Plan / UC Course  I have reviewed the triage vital signs and the nursing notes.  Pertinent labs & imaging results that were available during my care of the patient were reviewed by me and considered in my medical decision making (see chart for details).  Vitals in triage reviewed, patient is hemodynamically stable.  Vaginal irritation, itching and increased white discharge consistent with yeast vaginitis, will treat empirically with fluconazole.  Cytology swab obtained.  Several contact if additional treatment is needed.  Urinalysis does not show any evidence of UTI.  Plan of care, follow-up care return precautions given, no questions at this time.    Final Clinical Impressions(s) / UC Diagnoses   Final diagnoses:  Vaginal discharge     Discharge Instructions      Your urine does not show any signs of infection.  Take the fluconazole, this will treat a yeast infection.  Your cytology swab will be back over the next few days and our staff will contact you with additional treatment as needed.  Abstain from intercourse until results have been received.  Return to clinic for any new or urgent symptoms.     ED Prescriptions     Medication Sig Dispense Auth. Provider   fluconazole (DIFLUCAN) 150 MG tablet Take 1 tablet (150 mg total) by mouth daily. 1 tablet Harlow Lighter, Chrisette Man  N, FNP      PDMP not reviewed this encounter.   Robynn Christian, FNP 08/10/23 1224

## 2023-08-10 NOTE — ED Triage Notes (Signed)
 Pt had some vaginal itching and now having vaginal discharge white, slimy for a couple days. Denies urinary problems.

## 2023-08-13 ENCOUNTER — Telehealth (HOSPITAL_COMMUNITY): Payer: Self-pay

## 2023-08-13 LAB — CERVICOVAGINAL ANCILLARY ONLY
Bacterial Vaginitis (gardnerella): POSITIVE — AB
Candida Glabrata: NEGATIVE
Candida Vaginitis: POSITIVE — AB
Chlamydia: NEGATIVE
Comment: NEGATIVE
Comment: NEGATIVE
Comment: NEGATIVE
Comment: NEGATIVE
Comment: NEGATIVE
Comment: NORMAL
Neisseria Gonorrhea: NEGATIVE
Trichomonas: NEGATIVE

## 2023-08-13 MED ORDER — METRONIDAZOLE 500 MG PO TABS: 500.0000 mg | ORAL_TABLET | Freq: Two times a day (BID) | ORAL | 0 refills | Status: AC

## 2023-08-13 NOTE — Telephone Encounter (Signed)
 Per protocol, pt requires tx with metronidazole. Rx sent to pharmacy on file.

## 2023-10-24 ENCOUNTER — Ambulatory Visit (HOSPITAL_COMMUNITY)

## 2023-10-31 ENCOUNTER — Ambulatory Visit (HOSPITAL_COMMUNITY)

## 2023-11-26 ENCOUNTER — Ambulatory Visit: Admitting: Obstetrics and Gynecology

## 2023-11-26 ENCOUNTER — Other Ambulatory Visit: Payer: Self-pay

## 2023-11-26 ENCOUNTER — Other Ambulatory Visit (HOSPITAL_COMMUNITY)
Admission: RE | Admit: 2023-11-26 | Discharge: 2023-11-26 | Disposition: A | Discharge status: Home / Self Care | Source: Ambulatory Visit | Attending: Obstetrics and Gynecology | Admitting: Obstetrics and Gynecology

## 2023-11-26 ENCOUNTER — Encounter: Payer: Self-pay | Admitting: Family Medicine

## 2023-11-26 ENCOUNTER — Encounter: Payer: Self-pay | Admitting: Obstetrics and Gynecology

## 2023-11-26 VITALS — BP 111/74 | HR 81 | Wt 147.0 lb

## 2023-11-26 DIAGNOSIS — Z113 Encounter for screening for infections with a predominantly sexual mode of transmission: Secondary | ICD-10-CM

## 2023-11-26 DIAGNOSIS — Z124 Encounter for screening for malignant neoplasm of cervix: Secondary | ICD-10-CM

## 2023-11-26 DIAGNOSIS — Z30431 Encounter for routine checking of intrauterine contraceptive device: Secondary | ICD-10-CM

## 2023-11-26 NOTE — Progress Notes (Signed)
 Obstetrics and Gynecology New Patient Evaluation  Appointment Date: 11/26/2023  OB[facility] Clinic: [facility]  Primary Care Provider: None  Referring Provider: self  Chief Complaint:  Chief Complaint  Patient presents with   IUD Check     History of Present Illness: Emma Hines is a 23 y.o.  G1P1001, seen for the above chief complaint. Hx negative.  Patient had a liletta  placed 10/2018 and is here for a check up; pt has never had a pap and doesn't get periods. Patient states she rarely has spotting.    Review of Systems: Pertinent items are noted in HPI.   Past Medical History:  Past Medical History:  Diagnosis Date   Hypokalemia 10/21/2018   Iron deficiency anemia 10/21/2018   Preeclampsia, severe, third trimester 10/21/2018   Past Surgical History:  No past surgical history on file. Past Obstetrical History:  OB History  Gravida Para Term Preterm AB Living  1 1 1   1   SAB IAB Ectopic Multiple Live Births     0 1    # Outcome Date GA Lbr Len/2nd Weight Sex Type Anes PTL Lv  1 Term 10/21/18 [redacted]w[redacted]d 25:15 / 01:36 7 lb 13.4 oz (3.555 kg) F Vag-Spont EPI  LIV   Past Gynecological History: As per HPI.  Social History:  Social History   Socioeconomic History   Marital status: Single    Spouse name: Not on file   Number of children: Not on file   Years of education: Not on file   Highest education level: Not on file  Occupational History   Not on file  Tobacco Use   Smoking status: Never   Smokeless tobacco: Never  Vaping Use   Vaping status: Never Used  Substance and Sexual Activity   Alcohol use: Never   Drug use: Never   Sexual activity: Yes    Birth control/protection: I.U.D.  Other Topics Concern   Not on file  Social History Narrative   Not on file   Social Drivers of Health   Financial Resource Strain: Not on file  Food Insecurity: Not on file  Transportation Needs: No Transportation Needs (10/21/2018)    PRAPARE - Administrator, Civil Service (Medical): No    Lack of Transportation (Non-Medical): No  Physical Activity: Not on file  Stress: Not on file  Social Connections: Not on file  Intimate Partner Violence: Not At Risk (10/21/2018)   Humiliation, Afraid, Rape, and Kick questionnaire    Fear of Current or Ex-Partner: No    Emotionally Abused: No    Physically Abused: No    Sexually Abused: No   Family History:  Family History  Problem Relation Age of Onset   Hypertension Mother    Hypertension Father     Medications Xoe J. Enck had no medications administered during this visit. Current Outpatient Medications  Medication Sig Dispense Refill   levonorgestrel  (LILETTA , 52 MG,) 20.1 MCG/DAY IUD IUD 1 each by Intrauterine route once.     No current facility-administered medications for this visit.   Allergies [name]  [ferumoxytol ] and Ibuprofen   Physical Exam:  BP 111/74   Pulse 81   Wt 147 lb (66.7 kg)   BMI 25.23 kg/m  Body mass index is 25.23 kg/m.  General appearance: Well nourished, well developed female in no acute distress.  Neck:  Supple, normal appearance, and no thyromegaly  Respiratory:  Normal respiratory effort Neuro/Psych:  Normal mood and affect.  Skin:  Warm  and dry.   Cervical exam performed in the presence of a chaperone Pelvic exam: is not limited by body habitus EGBUS: within normal limits Vagina: within normal limits and with no blood or discharge in the vault Cervix: normal appearing cervix without tenderness, discharge or lesions. IUD strings 2-3cm Uterus:  nonenlarged and non tender Adnexa:  normal adnexa and no mass, fullness, tenderness Rectovaginal: deferred  Laboratory: none  Radiology: none  Assessment: patient doing well  Plan:  1. Encounter for Papanicolaou smear for cervical cancer screening (Primary) D/w her if normal, repeat q3y - Cytology - PAP( [place])  2. IUD check up D/w her good for eight  years, as of now, but may increase in the future Patient wondering about getting a pcp and I showed her how to use mychart to make an appointment  3. Screening examination for STD (sexually transmitted disease) Desires swab screening, no blood testing  RTC PRN   Bebe Izell Raddle MD Attending [facility])

## 2023-12-01 LAB — CYTOLOGY - PAP
Chlamydia: NEGATIVE
Comment: NEGATIVE
Comment: NEGATIVE
Comment: NORMAL
Diagnosis: NEGATIVE
Neisseria Gonorrhea: NEGATIVE
Trichomonas: NEGATIVE

## 2023-12-05 ENCOUNTER — Ambulatory Visit: Payer: Self-pay | Admitting: Obstetrics and Gynecology

## 2023-12-31 ENCOUNTER — Ambulatory Visit (HOSPITAL_COMMUNITY)

## 2024-01-02 ENCOUNTER — Ambulatory Visit (HOSPITAL_COMMUNITY)

## 2024-01-04 ENCOUNTER — Ambulatory Visit (HOSPITAL_COMMUNITY)

## 2024-01-07 ENCOUNTER — Encounter (HOSPITAL_COMMUNITY): Payer: Self-pay

## 2024-01-07 ENCOUNTER — Ambulatory Visit (HOSPITAL_COMMUNITY)
Admission: RE | Admit: 2024-01-07 | Discharge: 2024-01-07 | Disposition: A | Discharge status: Home / Self Care | Source: Ambulatory Visit | Attending: Physician Assistant | Admitting: Physician Assistant

## 2024-01-07 VITALS — BP 101/68 | HR 79 | Temp 98.0°F | Resp 16

## 2024-01-07 DIAGNOSIS — N898 Other specified noninflammatory disorders of vagina: Secondary | ICD-10-CM | POA: Insufficient documentation

## 2024-01-07 MED ORDER — FLUCONAZOLE 150 MG PO TABS: 150.0000 mg | ORAL_TABLET | Freq: Once | ORAL | 0 refills | Status: DC

## 2024-01-07 MED ORDER — FLUCONAZOLE 150 MG PO TABS: 150.0000 mg | ORAL_TABLET | ORAL | 0 refills | Status: DC | PRN

## 2024-01-07 NOTE — Discharge Instructions (Addendum)
 We are treating you for a yeast infection.  Take Diflucan  today and a second dose in 3 days if your symptoms persist.  I will contact you if your swab actually shows bacterial vaginosis and we need to change your treatment plan.  Wear loosefitting cotton underwear and use hypoallergenic soaps and detergents.  If you develop any pelvic pain, abdominal pain, fever, nausea, vomiting you need to be seen immediately.

## 2024-01-07 NOTE — ED Provider Notes (Signed)
 [facility]    CSN: [id number] Arrival date & time: 01/07/24  9182      History   Chief Complaint Chief Complaint  Patient presents with   appt [id number]    HPI Emma Hines is a 23 y.o. female.   Patient presents today with a weeklong history of vaginal irritation.  She reports some thin discharge but denies any associated odor.  She denies additional symptoms including pelvic pain, abdominal pain, fever, nausea, vomiting.  She is confident that she is not pregnant as she has an IUD.  She denies any changes to personal hygiene products including soaps, detergents, recent medication changes, recent antibiotic use.  She has not tried any over-the-counter medication for symptom management.  She is confident that she does not have an STI as she is screened regularly with her OB/GYN and declined testing for this as she is more concerned about BV versus yeast.  She denies history of diabetes and does not take an SGLT2 inhibitor.    Past Medical History:  Diagnosis Date   Hypokalemia 10/21/2018   Iron deficiency anemia 10/21/2018   Preeclampsia, severe, third trimester 10/21/2018    Patient Active Problem List   Diagnosis Date Noted   IUD check up 12/17/2018    No past surgical history on file.  OB History     Gravida  1   Para  1   Term  1   Preterm      AB      Living  1      SAB      IAB      Ectopic      Multiple  0   Live Births  1            Home Medications    Prior to Admission medications   Medication Sig Start Date End Date Taking? Authorizing Provider  fluconazole  (DIFLUCAN ) 150 MG tablet Take 1 tablet (150 mg total) by mouth every 3 (three) days as needed. 01/07/24   Kahliyah Dick K, PA-C  levonorgestrel  (LILETTA , 52 MG,) 20.1 MCG/DAY IUD IUD 1 each by Intrauterine route once. 11/19/18   [provider]    Family History Family History  Problem Relation Age of Onset   Hypertension Mother    Hypertension Father      Social History Social History   Tobacco Use   Smoking status: Never   Smokeless tobacco: Never  Vaping Use   Vaping status: Never Used  Substance Use Topics   Alcohol use: Never   Drug use: Never     Allergies   [name]  [ferumoxytol ] and Ibuprofen    Review of Systems Review of Systems  Constitutional:  Negative for activity change, appetite change, fatigue and fever.  Gastrointestinal:  Negative for abdominal pain, diarrhea, nausea and vomiting.  Genitourinary:  Positive for vaginal discharge and vaginal pain (Irritation). Negative for dysuria, frequency, urgency and vaginal bleeding.     Physical Exam Triage Vital Signs ED Triage Vitals  Encounter Vitals Group     BP 01/07/24 0829 101/68     Girls Systolic BP Percentile --      Girls Diastolic BP Percentile --      Boys Systolic BP Percentile --      Boys Diastolic BP Percentile --      Pulse Rate 01/07/24 0829 79     Resp 01/07/24 0829 16     Temp 01/07/24 0829 98 F (36.7 C)     Temp Source  01/07/24 0829 Oral     SpO2 01/07/24 0829 98 %     Weight --      Height --      Head Circumference --      Peak Flow --      Pain Score 01/07/24 0828 0     Pain Loc --      Pain Education --      Exclude from Growth Chart --    No data found.  Updated Vital Signs BP 101/68 (BP Location: Right Arm)   Pulse 79   Temp 98 F (36.7 C) (Oral)   Resp 16   SpO2 98%   Visual Acuity Right Eye Distance:   Left Eye Distance:   Bilateral Distance:    Right Eye Near:   Left Eye Near:    Bilateral Near:     Physical Exam Vitals reviewed.  Constitutional:      General: She is awake. She is not in acute distress.    Appearance: Normal appearance. She is well-developed. She is not ill-appearing.     Comments: Very pleasant female appears stated age in no acute distress sitting comfortably in exam room  HENT:     Head: Normocephalic and atraumatic.  Cardiovascular:     Rate and Rhythm: Normal rate and regular  rhythm.     Heart sounds: Normal heart sounds, S1 normal and S2 normal. No murmur heard. Pulmonary:     Effort: Pulmonary effort is normal.     Breath sounds: Normal breath sounds. No wheezing, rhonchi or rales.     Comments: Clear to auscultation bilaterally Abdominal:     Palpations: Abdomen is soft.     Tenderness: There is no abdominal tenderness. There is no right CVA tenderness, left CVA tenderness, guarding or rebound.  Genitourinary:    Comments: Exam deferred Psychiatric:        Behavior: Behavior is cooperative.      UC Treatments / Results  Labs (all labs ordered are listed, but only abnormal results are displayed) Labs Reviewed  CERVICOVAGINAL ANCILLARY ONLY    EKG   Radiology No results found.  Procedures Procedures (including critical care time)  Medications Ordered in UC Medications - No data to display  Initial Impression / Assessment and Plan / UC Course  I have reviewed the triage vital signs and the nursing notes.  Pertinent labs & imaging results that were available during my care of the patient were reviewed by me and considered in my medical decision making (see chart for details).     Patient is well-appearing, afebrile, nontoxic, nontachycardic.  She denies any alarm symptoms.  Patient is comfort that she is not pregnant and declined to do a urine pregnancy test in clinic.  Given her clinical presentation will empirically treat for yeast vaginitis with Diflucan .  Will send Aptima swab to test for BV versus yeast and contact her if need to change treatment plan based on her swab results.  I did offer STI testing with Aptima swab but she declined this as she had no concern for STI and recently had testing with her OB/GYN.  We discussed that if she develops any symptoms including abdominal pain, pelvic pain, fever, nausea, vomiting she needs to be seen immediately.  Strict return precautions given.  Final Clinical Impressions(s) / UC Diagnoses    Final diagnoses:  Vaginal irritation  Vaginal discharge     Discharge Instructions      We are treating you for a yeast  infection.  Take Diflucan  today and a second dose in 3 days if your symptoms persist.  I will contact you if your swab actually shows bacterial vaginosis and we need to change your treatment plan.  Wear loosefitting cotton underwear and use hypoallergenic soaps and detergents.  If you develop any pelvic pain, abdominal pain, fever, nausea, vomiting you need to be seen immediately.     ED Prescriptions     Medication Sig Dispense Auth. Provider   fluconazole  (DIFLUCAN ) 150 MG tablet  (Status: Discontinued) Take 1 tablet (150 mg total) by mouth once for 1 dose. 1 tablet Yicel Shannon K, PA-C   fluconazole  (DIFLUCAN ) 150 MG tablet Take 1 tablet (150 mg total) by mouth every 3 (three) days as needed. 2 tablet Ahmet Schank K, PA-C      PDMP not reviewed this encounter.   Sherrell Rocky POUR, PA-C 01/07/24 0901

## 2024-01-07 NOTE — ED Triage Notes (Signed)
 Pt reports since last week had vaginal discharge, vaginal irritation and some itching. Pt can't describe color of discharge.  Pt reports that she knows it isnt STD as she had testing at [facility] not too long ago.

## 2024-01-08 ENCOUNTER — Ambulatory Visit (HOSPITAL_COMMUNITY): Payer: Self-pay

## 2024-01-08 LAB — CERVICOVAGINAL ANCILLARY ONLY
Bacterial Vaginitis (gardnerella): POSITIVE — AB
Candida Glabrata: NEGATIVE
Candida Vaginitis: POSITIVE — AB
Comment: NEGATIVE
Comment: NEGATIVE
Comment: NEGATIVE

## 2024-01-08 MED ORDER — METRONIDAZOLE 500 MG PO TABS: 500.0000 mg | ORAL_TABLET | Freq: Two times a day (BID) | ORAL | 0 refills | Status: AC

## 2024-03-14 ENCOUNTER — Ambulatory Visit (HOSPITAL_COMMUNITY)

## 2024-03-16 ENCOUNTER — Encounter (HOSPITAL_COMMUNITY): Payer: Self-pay

## 2024-03-16 ENCOUNTER — Ambulatory Visit (HOSPITAL_COMMUNITY)
Admission: RE | Admit: 2024-03-16 | Discharge: 2024-03-16 | Disposition: A | Discharge status: Home / Self Care | Source: Ambulatory Visit | Attending: Family Medicine | Admitting: Family Medicine

## 2024-03-16 VITALS — BP 104/68 | HR 91 | Temp 98.2°F | Resp 16

## 2024-03-16 DIAGNOSIS — N3090 Cystitis, unspecified without hematuria: Secondary | ICD-10-CM | POA: Diagnosis not present

## 2024-03-16 LAB — POCT URINE DIPSTICK
Bilirubin, UA: NEGATIVE
Blood, UA: NEGATIVE
Glucose, UA: NEGATIVE mg/dL
Ketones, POC UA: NEGATIVE mg/dL
Nitrite, UA: NEGATIVE
POC PROTEIN,UA: NEGATIVE
Spec Grav, UA: 1.02 (ref 1.010–1.025)
Urobilinogen, UA: 1 U/dL
pH, UA: 6.5 (ref 5.0–8.0)

## 2024-03-16 MED ORDER — PHENAZOPYRIDINE HCL 100 MG PO TABS: 100.0000 mg | ORAL_TABLET | Freq: Three times a day (TID) | ORAL | 0 refills | Status: AC | PRN

## 2024-03-16 MED ORDER — NITROFURANTOIN MONOHYD MACRO 100 MG PO CAPS: 100.0000 mg | ORAL_CAPSULE | Freq: Two times a day (BID) | ORAL | 0 refills | Status: AC

## 2024-03-16 NOTE — Discharge Instructions (Signed)
 Urinalysis has some white blood cells, consistent with a bladder infection  Take nitrofurantoin 100 mg--1 capsule 2 times daily for 5 days  Take Pyridium/phenazopyridine 100 mg--1 tablet 3 times daily as needed for urinary pain.  This medication usually makes the urine orange  Urine culture is sent, and staff will notify you that it looks like the antibiotic needs to be changed.  Drink plenty fluids

## 2024-03-16 NOTE — ED Triage Notes (Signed)
 Pt c/o burning on urination with urgency and bladder presser x2-3 days. States her urine is cloudy and has an odor.

## 2024-03-16 NOTE — ED Provider Notes (Signed)
 [facility]    CSN: [id number] Arrival date & time: 03/16/24  1652      History   Chief Complaint Chief Complaint  Patient presents with   APPT - UTI sx's    HPI Emma Hines is a 23 y.o. female.   HPI Here for dysuria, urinary frequency and incomplete bladder emptying.  No fever or chills and no nausea or vomiting.  She is allergic to ibuprofen  and iron  She has not had a menstrual cycle in a while and she has the Liletta  IUD   Past Medical History:  Diagnosis Date   Hypokalemia 10/21/2018   Iron deficiency anemia 10/21/2018   Preeclampsia, severe, third trimester 10/21/2018    Patient Active Problem List   Diagnosis Date Noted   IUD check up 12/17/2018    History reviewed. No pertinent surgical history.  OB History     Gravida  1   Para  1   Term  1   Preterm      AB      Living  1      SAB      IAB      Ectopic      Multiple  0   Live Births  1            Home Medications    Prior to Admission medications   Medication Sig Start Date End Date Taking? Authorizing Provider  nitrofurantoin, macrocrystal-monohydrate, (MACROBID) 100 MG capsule Take 1 capsule (100 mg total) by mouth 2 (two) times daily. 03/16/24  Yes Baldemar Dady K, MD  phenazopyridine (PYRIDIUM) 100 MG tablet Take 1 tablet (100 mg total) by mouth 3 (three) times daily as needed (urinary pain). 03/16/24  Yes Vonna Sharlet POUR, MD  levonorgestrel  (LILETTA , 52 MG,) 20.1 MCG/DAY IUD IUD 1 each by Intrauterine route once. 11/19/18   [provider]    Family History Family History  Problem Relation Age of Onset   Hypertension Mother    Hypertension Father     Social History Social History   Tobacco Use   Smoking status: Never   Smokeless tobacco: Never  Vaping Use   Vaping status: Never Used  Substance Use Topics   Alcohol use: Never   Drug use: Never     Allergies   [name]  [ferumoxytol ] and Ibuprofen    Review of  Systems Review of Systems   Physical Exam Triage Vital Signs ED Triage Vitals  Encounter Vitals Group     BP 03/16/24 1717 104/68     Girls Systolic BP Percentile --      Girls Diastolic BP Percentile --      Boys Systolic BP Percentile --      Boys Diastolic BP Percentile --      Pulse Rate 03/16/24 1717 91     Resp 03/16/24 1717 16     Temp 03/16/24 1717 98.2 F (36.8 C)     Temp Source 03/16/24 1717 Oral     SpO2 03/16/24 1717 98 %     Weight --      Height --      Head Circumference --      Peak Flow --      Pain Score 03/16/24 1716 0     Pain Loc --      Pain Education --      Exclude from Growth Chart --    No data found.  Updated Vital Signs BP 104/68 (BP Location: Left Arm)  Pulse 91   Temp 98.2 F (36.8 C) (Oral)   Resp 16   SpO2 98%   Visual Acuity Right Eye Distance:   Left Eye Distance:   Bilateral Distance:    Right Eye Near:   Left Eye Near:    Bilateral Near:     Physical Exam Vitals reviewed.  Constitutional:      General: She is not in acute distress.    Appearance: She is not toxic-appearing.  HENT:     Mouth/Throat:     Mouth: Mucous membranes are moist.     Pharynx: No oropharyngeal exudate or posterior oropharyngeal erythema.  Cardiovascular:     Rate and Rhythm: Normal rate and regular rhythm.     Heart sounds: No murmur heard. Pulmonary:     Effort: Pulmonary effort is normal. No respiratory distress.     Breath sounds: No stridor. No wheezing, rhonchi or rales.  Abdominal:     Palpations: Abdomen is soft.     Tenderness: There is no abdominal tenderness.  Skin:    Coloration: Skin is not jaundiced or pale.  Neurological:     General: No focal deficit present.     Mental Status: She is alert and oriented to person, place, and time.  Psychiatric:        Behavior: Behavior normal.      UC Treatments / Results  Labs (all labs ordered are listed, but only abnormal results are displayed) Labs Reviewed  POCT URINE  DIPSTICK - Abnormal; Notable for the following components:      Result Value   Clarity, UA turbid (*)    Leukocytes, UA Small (1+) (*)    All other components within normal limits  URINE CULTURE    EKG   Radiology No results found.  Procedures Procedures (including critical care time)  Medications Ordered in UC Medications - No data to display  Initial Impression / Assessment and Plan / UC Course  I have reviewed the triage vital signs and the nursing notes.  Pertinent labs & imaging results that were available during my care of the patient were reviewed by me and considered in my medical decision making (see chart for details).     Urinalysis shows a small amount of white blood cells.  Nitrofurantoin is sent in for the cystitis and Pyridium is sent in for her symptoms. Urine culture is sent and staff will notify her if it looks like the antibiotic needs to be changed. Final Clinical Impressions(s) / UC Diagnoses   Final diagnoses:  Cystitis     Discharge Instructions      Urinalysis has some white blood cells, consistent with a bladder infection  Take nitrofurantoin 100 mg--1 capsule 2 times daily for 5 days  Take Pyridium/phenazopyridine 100 mg--1 tablet 3 times daily as needed for urinary pain.  This medication usually makes the urine orange  Urine culture is sent, and staff will notify you that it looks like the antibiotic needs to be changed.  Drink plenty fluids      ED Prescriptions     Medication Sig Dispense Auth. Provider   nitrofurantoin, macrocrystal-monohydrate, (MACROBID) 100 MG capsule Take 1 capsule (100 mg total) by mouth 2 (two) times daily. 10 capsule Michai Dieppa K, MD   phenazopyridine (PYRIDIUM) 100 MG tablet Take 1 tablet (100 mg total) by mouth 3 (three) times daily as needed (urinary pain). 10 tablet Vonna Jessiah Steinhart K, MD      PDMP not reviewed this encounter.  Vonna Sharlet POUR, MD 03/16/24 [id number]

## 2024-03-18 ENCOUNTER — Ambulatory Visit (HOSPITAL_COMMUNITY): Payer: Self-pay

## 2024-03-18 LAB — URINE CULTURE: Culture: >=100000 — AB
